# Patient Record
Sex: Female | Born: 1935 | Race: White | Hispanic: No | Marital: Married
Health system: Southern US, Community
[De-identification: ages and names within clinical notes are randomized; demographics above are authoritative.]

---

## 2003-12-26 ENCOUNTER — Ambulatory Visit: Payer: Self-pay | Admitting: Family Medicine

## 2004-11-18 ENCOUNTER — Emergency Department: Payer: Self-pay | Admitting: Unknown Physician Specialty

## 2005-01-13 ENCOUNTER — Ambulatory Visit: Payer: Self-pay

## 2005-09-06 ENCOUNTER — Ambulatory Visit: Payer: Self-pay | Admitting: Family Medicine

## 2006-12-06 ENCOUNTER — Ambulatory Visit: Payer: Self-pay | Admitting: Ophthalmology

## 2007-03-05 ENCOUNTER — Ambulatory Visit: Payer: Self-pay | Admitting: Family Medicine

## 2007-04-05 ENCOUNTER — Ambulatory Visit: Payer: Self-pay | Admitting: Family Medicine

## 2008-10-17 ENCOUNTER — Ambulatory Visit: Payer: Self-pay | Admitting: Family Medicine

## 2008-11-07 ENCOUNTER — Ambulatory Visit: Payer: Self-pay | Admitting: Internal Medicine

## 2008-11-12 ENCOUNTER — Ambulatory Visit: Payer: Self-pay | Admitting: Internal Medicine

## 2008-11-18 ENCOUNTER — Emergency Department: Payer: Self-pay | Admitting: Emergency Medicine

## 2009-05-01 ENCOUNTER — Ambulatory Visit: Payer: Self-pay | Admitting: Gastroenterology

## 2009-05-05 ENCOUNTER — Ambulatory Visit: Payer: Self-pay | Admitting: Gastroenterology

## 2010-08-25 ENCOUNTER — Ambulatory Visit: Payer: Self-pay | Admitting: Internal Medicine

## 2012-02-18 ENCOUNTER — Emergency Department: Payer: Self-pay | Admitting: Emergency Medicine

## 2012-02-28 ENCOUNTER — Emergency Department: Payer: Self-pay | Admitting: Emergency Medicine

## 2012-03-04 ENCOUNTER — Emergency Department: Payer: Self-pay | Admitting: Emergency Medicine

## 2012-03-04 LAB — URINALYSIS, COMPLETE
Bilirubin,UR: NEGATIVE
Blood: NEGATIVE
Glucose,UR: NEGATIVE mg/dL (ref 0–75)
Ketone: NEGATIVE
Leukocyte Esterase: NEGATIVE
Nitrite: NEGATIVE
Ph: 7 (ref 4.5–8.0)
Protein: NEGATIVE
RBC,UR: 1 /HPF (ref 0–5)
Specific Gravity: 1.015 (ref 1.003–1.030)
Squamous Epithelial: NONE SEEN
WBC UR: 2 /HPF (ref 0–5)

## 2012-03-04 LAB — COMPREHENSIVE METABOLIC PANEL
Albumin: 2.8 g/dL — ABNORMAL LOW (ref 3.4–5.0)
Alkaline Phosphatase: 107 U/L (ref 50–136)
Anion Gap: 6 — ABNORMAL LOW (ref 7–16)
Bilirubin,Total: 0.4 mg/dL (ref 0.2–1.0)
Co2: 29 mmol/L (ref 21–32)
Creatinine: 0.82 mg/dL (ref 0.60–1.30)
EGFR (Non-African Amer.): >60
Glucose: 86 mg/dL (ref 65–99)
Potassium: 4.1 mmol/L (ref 3.5–5.1)
SGOT(AST): 16 U/L (ref 15–37)
Total Protein: 6.2 g/dL — ABNORMAL LOW (ref 6.4–8.2)

## 2012-03-04 LAB — CBC
HCT: 44.8 % (ref 35.0–47.0)
HGB: 15.3 g/dL (ref 12.0–16.0)
MCH: 29.1 pg (ref 26.0–34.0)
MCV: 85 fL (ref 80–100)
Platelet: 313 10*3/uL (ref 150–440)

## 2012-03-04 LAB — PRO B NATRIURETIC PEPTIDE: B-Type Natriuretic Peptide: 678 pg/mL — ABNORMAL HIGH (ref 0–450)

## 2012-03-04 LAB — CK TOTAL AND CKMB (NOT AT ARMC): CK-MB: 0.8 ng/mL (ref 0.5–3.6)

## 2012-05-05 ENCOUNTER — Observation Stay: Payer: Self-pay | Admitting: Internal Medicine

## 2012-05-05 LAB — COMPREHENSIVE METABOLIC PANEL
Alkaline Phosphatase: 90 U/L (ref 50–136)
Anion Gap: 6 — ABNORMAL LOW (ref 7–16)
Calcium, Total: 8.3 mg/dL — ABNORMAL LOW (ref 8.5–10.1)
Chloride: 96 mmol/L — ABNORMAL LOW (ref 98–107)
Co2: 28 mmol/L (ref 21–32)
Creatinine: 0.74 mg/dL (ref 0.60–1.30)
EGFR (African American): >60
EGFR (Non-African Amer.): >60
Glucose: 87 mg/dL (ref 65–99)
Osmolality: 262 (ref 275–301)
Potassium: 4.2 mmol/L (ref 3.5–5.1)
SGOT(AST): 33 U/L (ref 15–37)
Total Protein: 6.5 g/dL (ref 6.4–8.2)

## 2012-05-05 LAB — LIPID PANEL
Cholesterol: 176 mg/dL (ref 0–200)
Ldl Cholesterol, Calc: 112 mg/dL — ABNORMAL HIGH (ref 0–100)
Triglycerides: 148 mg/dL (ref 0–200)

## 2012-05-05 LAB — URINALYSIS, COMPLETE
Blood: NEGATIVE
Glucose,UR: NEGATIVE mg/dL (ref 0–75)
Leukocyte Esterase: NEGATIVE
Ph: 7 (ref 4.5–8.0)
RBC,UR: <1 /HPF (ref 0–5)
Specific Gravity: 1.015 (ref 1.003–1.030)
WBC UR: <1 /HPF (ref 0–5)

## 2012-05-05 LAB — CK TOTAL AND CKMB (NOT AT ARMC)
CK, Total: 81 U/L (ref 21–215)
CK-MB: 0.7 ng/mL (ref 0.5–3.6)

## 2012-05-05 LAB — CBC
HCT: 46 % (ref 35.0–47.0)
HGB: 16.1 g/dL — ABNORMAL HIGH (ref 12.0–16.0)
MCH: 29.4 pg (ref 26.0–34.0)
MCHC: 34.9 g/dL (ref 32.0–36.0)
MCV: 84 fL (ref 80–100)
RBC: 5.46 10*6/uL — ABNORMAL HIGH (ref 3.80–5.20)
RDW: 14.6 % — ABNORMAL HIGH (ref 11.5–14.5)

## 2012-05-06 LAB — CBC WITH DIFFERENTIAL/PLATELET
Basophil %: 1.3 %
Eosinophil %: 0.7 %
HCT: 40.9 % (ref 35.0–47.0)
HGB: 14.3 g/dL (ref 12.0–16.0)
Lymphocyte %: 15.4 %
MCH: 29.5 pg (ref 26.0–34.0)
MCHC: 35.1 g/dL (ref 32.0–36.0)
Monocyte #: 0.9 x10 3/mm (ref 0.2–0.9)
Monocyte %: 9.8 %
Platelet: 195 10*3/uL (ref 150–440)
RDW: 14.2 % (ref 11.5–14.5)

## 2012-05-06 LAB — BASIC METABOLIC PANEL
Anion Gap: 9 (ref 7–16)
BUN: 15 mg/dL (ref 7–18)
Calcium, Total: 7.6 mg/dL — ABNORMAL LOW (ref 8.5–10.1)
Co2: 24 mmol/L (ref 21–32)
EGFR (African American): >60
EGFR (Non-African Amer.): >60
Glucose: 87 mg/dL (ref 65–99)
Sodium: 136 mmol/L (ref 136–145)

## 2012-05-06 LAB — URINE CULTURE

## 2012-05-06 LAB — CK TOTAL AND CKMB (NOT AT ARMC): CK-MB: <0.5 ng/mL — ABNORMAL LOW (ref 0.5–3.6)

## 2012-05-06 LAB — TROPONIN I: Troponin-I: <0.02 ng/mL

## 2012-05-08 LAB — CBC WITH DIFFERENTIAL/PLATELET
Basophil #: 0 10*3/uL (ref 0.0–0.1)
Basophil %: 0.3 %
Eosinophil #: 0.2 10*3/uL (ref 0.0–0.7)
Eosinophil %: 1.9 %
HCT: 42 % (ref 35.0–47.0)
Lymphocyte #: 1.5 10*3/uL (ref 1.0–3.6)
MCH: 29 pg (ref 26.0–34.0)
MCHC: 34.5 g/dL (ref 32.0–36.0)
MCV: 84 fL (ref 80–100)
Monocyte #: 0.8 x10 3/mm (ref 0.2–0.9)
Monocyte %: 7.2 %
Neutrophil #: 8.5 10*3/uL — ABNORMAL HIGH (ref 1.4–6.5)
Neutrophil %: 76.9 %
Platelet: 211 10*3/uL (ref 150–440)
RBC: 4.99 10*6/uL (ref 3.80–5.20)
WBC: 11 10*3/uL (ref 3.6–11.0)

## 2012-05-08 LAB — BASIC METABOLIC PANEL
Anion Gap: 5 — ABNORMAL LOW (ref 7–16)
BUN: 11 mg/dL (ref 7–18)
Chloride: 105 mmol/L (ref 98–107)
Creatinine: 0.65 mg/dL (ref 0.60–1.30)
EGFR (Non-African Amer.): >60
Potassium: 3.4 mmol/L — ABNORMAL LOW (ref 3.5–5.1)

## 2012-05-11 LAB — CULTURE, BLOOD (SINGLE)

## 2012-08-11 ENCOUNTER — Ambulatory Visit: Payer: Self-pay | Admitting: Internal Medicine

## 2012-11-21 ENCOUNTER — Inpatient Hospital Stay: Payer: Self-pay | Admitting: Internal Medicine

## 2012-11-21 LAB — COMPREHENSIVE METABOLIC PANEL
Albumin: 3 g/dL — ABNORMAL LOW (ref 3.4–5.0)
Anion Gap: 5 — ABNORMAL LOW (ref 7–16)
BUN: 19 mg/dL — ABNORMAL HIGH (ref 7–18)
Bilirubin,Total: 0.4 mg/dL (ref 0.2–1.0)
Calcium, Total: 8.7 mg/dL (ref 8.5–10.1)
Chloride: 102 mmol/L (ref 98–107)
Co2: 28 mmol/L (ref 21–32)
EGFR (African American): >60
EGFR (Non-African Amer.): >60
Glucose: 138 mg/dL — ABNORMAL HIGH (ref 65–99)
Osmolality: 275 (ref 275–301)
Potassium: 3.9 mmol/L (ref 3.5–5.1)
SGPT (ALT): 14 U/L (ref 12–78)
Total Protein: 6.6 g/dL (ref 6.4–8.2)

## 2012-11-21 LAB — URINALYSIS, COMPLETE
Blood: NEGATIVE
Glucose,UR: NEGATIVE mg/dL (ref 0–75)
Squamous Epithelial: 1
WBC UR: 10 /HPF (ref 0–5)

## 2012-11-21 LAB — CBC
HCT: 43.2 % (ref 35.0–47.0)
MCH: 29.3 pg (ref 26.0–34.0)
MCHC: 35.1 g/dL (ref 32.0–36.0)
MCV: 84 fL (ref 80–100)

## 2012-11-21 LAB — APTT: Activated PTT: 33.3 secs (ref 23.6–35.9)

## 2012-11-21 LAB — TROPONIN I: Troponin-I: <0.02 ng/mL

## 2012-11-22 LAB — CBC WITH DIFFERENTIAL/PLATELET
Basophil %: 0.1 %
Eosinophil %: 0 %
HGB: 11.4 g/dL — ABNORMAL LOW (ref 12.0–16.0)
Lymphocyte %: 6.4 %
MCHC: 35.9 g/dL (ref 32.0–36.0)
MCV: 82 fL (ref 80–100)
Neutrophil #: 12.2 10*3/uL — ABNORMAL HIGH (ref 1.4–6.5)
Neutrophil %: 85.6 %
RBC: 3.85 10*6/uL (ref 3.80–5.20)
WBC: 14.3 10*3/uL — ABNORMAL HIGH (ref 3.6–11.0)

## 2012-11-22 LAB — BASIC METABOLIC PANEL
Anion Gap: 4 — ABNORMAL LOW (ref 7–16)
Creatinine: 0.76 mg/dL (ref 0.60–1.30)
Osmolality: 273 (ref 275–301)
Potassium: 4.4 mmol/L (ref 3.5–5.1)
Sodium: 134 mmol/L — ABNORMAL LOW (ref 136–145)

## 2012-11-23 LAB — BASIC METABOLIC PANEL
BUN: 20 mg/dL — ABNORMAL HIGH (ref 7–18)
Calcium, Total: 6.9 mg/dL — CL (ref 8.5–10.1)
Creatinine: 0.7 mg/dL (ref 0.60–1.30)
EGFR (African American): >60
Potassium: 2.9 mmol/L — ABNORMAL LOW (ref 3.5–5.1)
Sodium: 138 mmol/L (ref 136–145)

## 2012-11-23 LAB — CBC WITH DIFFERENTIAL/PLATELET
Basophil #: 0 10*3/uL (ref 0.0–0.1)
Eosinophil #: 0 10*3/uL (ref 0.0–0.7)
HGB: 8.3 g/dL — ABNORMAL LOW (ref 12.0–16.0)
Lymphocyte %: 6.7 %
MCHC: 35.9 g/dL (ref 32.0–36.0)
MCV: 83 fL (ref 80–100)
Monocyte #: 1.1 x10 3/mm — ABNORMAL HIGH (ref 0.2–0.9)
Neutrophil %: 84 %
Platelet: 236 10*3/uL (ref 150–440)
RDW: 14 % (ref 11.5–14.5)
WBC: 12.1 10*3/uL — ABNORMAL HIGH (ref 3.6–11.0)

## 2012-11-23 LAB — MAGNESIUM: Magnesium: 1.2 mg/dL — ABNORMAL LOW

## 2012-11-24 LAB — POTASSIUM: Potassium: 3.4 mmol/L — ABNORMAL LOW (ref 3.5–5.1)

## 2012-11-24 LAB — URINE CULTURE

## 2012-11-24 LAB — MAGNESIUM: Magnesium: 2.4 mg/dL

## 2012-11-28 LAB — COMPREHENSIVE METABOLIC PANEL
Albumin: 1.8 g/dL — ABNORMAL LOW (ref 3.4–5.0)
Alkaline Phosphatase: 136 U/L (ref 50–136)
Anion Gap: 6 — ABNORMAL LOW (ref 7–16)
Bilirubin,Total: 0.7 mg/dL (ref 0.2–1.0)
Calcium, Total: 8.5 mg/dL (ref 8.5–10.1)
Co2: 28 mmol/L (ref 21–32)
EGFR (African American): >60
EGFR (Non-African Amer.): >60
Glucose: 115 mg/dL — ABNORMAL HIGH (ref 65–99)
Osmolality: 303 (ref 275–301)
SGOT(AST): 22 U/L (ref 15–37)
Sodium: 146 mmol/L — ABNORMAL HIGH (ref 136–145)
Total Protein: 5.4 g/dL — ABNORMAL LOW (ref 6.4–8.2)

## 2012-11-28 LAB — URINALYSIS, COMPLETE
Bacteria: NONE SEEN
Bilirubin,UR: NEGATIVE
Ketone: NEGATIVE
Leukocyte Esterase: NEGATIVE
Nitrite: NEGATIVE
Ph: 5 (ref 4.5–8.0)
Protein: NEGATIVE
RBC,UR: 1 /HPF (ref 0–5)
Specific Gravity: 1.021 (ref 1.003–1.030)

## 2012-11-28 LAB — CBC WITH DIFFERENTIAL/PLATELET
Basophil #: 0.2 10*3/uL — ABNORMAL HIGH (ref 0.0–0.1)
Eosinophil #: 0 10*3/uL (ref 0.0–0.7)
Eosinophil %: 0 %
HCT: 27.4 % — ABNORMAL LOW (ref 35.0–47.0)
MCHC: 34.1 g/dL (ref 32.0–36.0)
MCV: 86 fL (ref 80–100)
Monocyte #: 1.3 x10 3/mm — ABNORMAL HIGH (ref 0.2–0.9)
Monocyte %: 5 %
Platelet: 585 10*3/uL — ABNORMAL HIGH (ref 150–440)
RBC: 3.19 10*6/uL — ABNORMAL LOW (ref 3.80–5.20)
RDW: 14.4 % (ref 11.5–14.5)
WBC: 25.9 10*3/uL — ABNORMAL HIGH (ref 3.6–11.0)

## 2012-11-28 LAB — LIPASE, BLOOD: Lipase: 405 U/L — ABNORMAL HIGH (ref 73–393)

## 2012-11-29 ENCOUNTER — Inpatient Hospital Stay: Payer: Self-pay | Admitting: Family Medicine

## 2012-11-29 LAB — CREATININE, SERUM: Creatinine: 0.88 mg/dL (ref 0.60–1.30)

## 2012-11-29 LAB — MAGNESIUM: Magnesium: 1.9 mg/dL

## 2012-11-29 LAB — POTASSIUM: Potassium: 2.3 mmol/L — CL (ref 3.5–5.1)

## 2012-11-29 LAB — SODIUM: Sodium: 149 mmol/L — ABNORMAL HIGH (ref 136–145)

## 2012-11-30 ENCOUNTER — Ambulatory Visit: Payer: Self-pay | Admitting: Hospice and Palliative Medicine

## 2012-11-30 LAB — CBC WITH DIFFERENTIAL/PLATELET
Bands: 2 %
Basophil %: 0.1 %
Eosinophil %: 0 %
HCT: 30.5 % — ABNORMAL LOW (ref 35.0–47.0)
HGB: 10.1 g/dL — ABNORMAL LOW (ref 12.0–16.0)
Lymphocyte #: 0.8 10*3/uL — ABNORMAL LOW (ref 1.0–3.6)
Lymphocyte %: 2 %
Lymphocytes: 2 %
MCHC: 33 g/dL (ref 32.0–36.0)
MCV: 86 fL (ref 80–100)
Neutrophil %: 93.5 %
Platelet: 713 10*3/uL — ABNORMAL HIGH (ref 150–440)
RBC: 3.54 10*6/uL — ABNORMAL LOW (ref 3.80–5.20)
RDW: 14.8 % — ABNORMAL HIGH (ref 11.5–14.5)
Variant Lymphocyte - H1-Rlymph: 6 %
WBC: 42.7 10*3/uL — ABNORMAL HIGH (ref 3.6–11.0)

## 2012-11-30 LAB — HEPATIC FUNCTION PANEL A (ARMC)
Albumin: 1.8 g/dL — ABNORMAL LOW (ref 3.4–5.0)
Bilirubin,Total: 0.9 mg/dL (ref 0.2–1.0)
SGPT (ALT): 16 U/L (ref 12–78)
Total Protein: 5.6 g/dL — ABNORMAL LOW (ref 6.4–8.2)

## 2012-11-30 LAB — BASIC METABOLIC PANEL
Anion Gap: 9 (ref 7–16)
Chloride: 116 mmol/L — ABNORMAL HIGH (ref 98–107)
Co2: 22 mmol/L (ref 21–32)
Creatinine: 1.03 mg/dL (ref 0.60–1.30)
EGFR (African American): 60 — ABNORMAL LOW
EGFR (Non-African Amer.): 52 — ABNORMAL LOW
Glucose: 147 mg/dL — ABNORMAL HIGH (ref 65–99)
Potassium: 3.1 mmol/L — ABNORMAL LOW (ref 3.5–5.1)
Sodium: 147 mmol/L — ABNORMAL HIGH (ref 136–145)

## 2012-11-30 LAB — LIPASE, BLOOD: Lipase: 220 U/L (ref 73–393)

## 2012-11-30 LAB — LACTATE DEHYDROGENASE: LDH: 238 U/L (ref 81–246)

## 2012-12-03 LAB — CULTURE, BLOOD (SINGLE)

## 2012-12-18 ENCOUNTER — Ambulatory Visit: Payer: Self-pay | Admitting: Hospice and Palliative Medicine

## 2012-12-18 DEATH — deceased

## 2014-05-10 NOTE — Op Note (Signed)
PATIENT NAME:  Jennifer RedoMOORE, Olayinka D MR#:  [id number] DATE OF BIRTH:  February 26, 1931  DATE OF PROCEDURE:  11/22/2012  PREOPERATIVE DIAGNOSIS: Comminuted left supracondylar femur fracture with intracondylar extension.   POSTOPERATIVE DIAGNOSIS: Comminuted left supracondylar femur fracture with intracondylar extension.   PROCEDURE: Open reduction and internal fixation, left distal femur.   ANESTHESIA: Spinal.   SURGEON: Kennedy BuckerMichael Everardo Voris, M.D.   DESCRIPTION OF PROCEDURE: The patient was brought to the operating room, and after adequate spinal anesthesia was obtained, she was transferred to the fracture table; the right leg in a well legholder, left leg in the traction boot. Longitudinal traction was applied, and there was acceptable alignment in both AP and lateral projections with some slight posterior sag of the distal fragment. After prepped and draped in the usual sterile method, timeout procedure was completed. A distal incision was made over the lateral femoral condyle, and the IT band split and the distal femur exposed. A Biomet POLYAX 9-hole left femoral plate was applied. This had been measured preoperatively, slid up in a submuscular fashion and placed at the appropriate level distally. A center guide pin was placed, and a lag screw was inserted, although it really did not give compression because of severe osteopenia. There was severe osteopenia of the distal fragment. With AP and lateral imaging of the distal femur and appropriate position of the plate, the distal screw holes were filled using multiple locking screws and a central large locking screw. This gave a stable construct distally. Going proximally, at the proximal end of the plate, a  3 inch incision was made, and the subcutaneous tissue spread, the IT band split and the vastus lateralis elevated. The plate was slightly posterior. It was brought to the midportion of the plate and 3 proximal cortical screws were placed. Using the percutaneous  technique and using the drill sleeves, 3 additional screws were placed adjacent to these most proximal screws. AP and lateral imaging showed acceptable position with axial and lateral alignment with just slight displacement. At this point, the wounds were thoroughly irrigated. The IT band was repaired proximally with a running 0 Vicryl. Distally the IT band likewise closed, 2-0 Vicryl subcutaneously and skin staples. Xeroform, 4 x 4's, ABDs and tape applied.   Total EBL was approximately 500 mL with some fracture hematoma present.   IMPLANT: The left 9-hole, POLYAX femoral plate with multiple screws, proximal and distal. For the locking screws, a torque limiter was utilized.   There were no complications. No specimen. EBL was 500.   CONDITION: To recovery room, stable.     ____________________________ Leitha SchullerMichael J. Hence Derrick, MD mjm:dmm D: 11/22/2012 22:50:37 ET T: 11/22/2012 23:00:02 ET JOB#: [id number]  cc: Leitha SchullerMichael J. Brynda Heick, MD, <Dictator> Leitha SchullerMICHAEL J Lajuanda Penick MD ELECTRONICALLY SIGNED 11/23/2012 7:25

## 2014-05-10 NOTE — Discharge Summary (Signed)
PATIENT NAME:  Jennifer Ibarra, Jennifer Ibarra MR#:  [id number] DATE OF BIRTH:  [birth year]  DATE OF ADMISSION:  11/29/2012 DATE OF DISCHARGE:  11/30/2012  DISPOSITION: Hospice Home.   REASON FOR ADMISSION: Weakness, fever and poor appetite.   HISTORY OF PRESENT ILLNESS: This is a very nice 81 year old female who had been discharged on November 8th, before 2 days. The patient was hospitalized due to a left distal femur fracture. She underwent an open reduction and internal fixation, and her hospitalization was also complicated with E. coli urinary tract infection. The patient had also chronic respiratory failure due to COPD and had problems with her potassium and magnesium electrolytes. The patient was discharged to a skilled nursing facility/rehab due to her advanced dementia and not being able to go home as she needed continuous supervision. The patient was admitted after 2 days of not doing well at the skilled nursing facility. No participating in her therapy, very poor oral intake and starting to become lethargic. She had a temperature of 100.5, she was tachycardic in the 130s, her blood pressure was in the normal-low parameters and she looked critically ill. Her electrolytes were also imbalanced with hypernatremia, hypokalemia, looking severely dehydrated. Her belly was distended and tender. A CT scan of the abdomen was done showing some ileus. The patient was admitted for possible infection of unclear source. She was showing some symptoms of sepsis, including tachycardia, leukocytosis of 26,000 and a fever of 100.5 with tachypnea. The patient also had altered mental status and looked very sick.   HOSPITAL COURSE: The patient was put on the floor, and she was treated with broad-spectrum antibiotics, including Zosyn and vancomycin. When I evaluated the patient the following day, it looked her pathology could be respiratory as the patient was starting to have significant labored breathing, for which we added on  azithromycin to cover for atypical organisms for pneumonia. A chest x-ray was done at the moment, and it showed some chronic things, maybe atelectasis, maybe some fullness at the level of the right middle lobe and perihilar area, but this was not commented on in the x-rays by the radiologist. The patient started to decline significantly, and she communicated to her family her desire to just be left alone and that she was ready to die if that was her time. We had a long discussion with the family the next day because she was starting to get more labored breathing and having significant difficulties. The patient was not eating. She had a very distended belly. We offered to treat her with an NG tube to decompress her abdomen and maybe do a little bit more of catharsis as we started the previous day with suppositories, but the patient and the family did not really want to pursue anything for this, for which [facility].   DISCHARGE CONDITION: The patient is discharged in critical condition, looking septic, toxic, with decreased mental status, to the Hospice Home, and the family is agreeable to this.   TIME SPENT: I spent about 45 minutes with this patient at discharge.   ____________________________ Felipa Furnaceoberto Sanchez Gutierrez, MD rsg:lb Ibarra: 12/01/2012 06:59:08 ET T: 12/01/2012 09:13:54 ET JOB#: [id number]  cc: Felipa Furnaceoberto Sanchez Gutierrez, MD, <Dictator> Laysa Kimmey Juanda ChanceSANCHEZ GUTIERRE MD ELECTRONICALLY SIGNED 12/06/2012 23:10

## 2014-05-10 NOTE — Discharge Summary (Signed)
PATIENT NAME:  Jennifer Ibarra, Jennifer Ibarra MR#:  [id number] DATE OF BIRTH:  07/10/1931  DATE OF ADMISSION:  05/05/2012 DATE OF DISCHARGE:  05/11/2012  FINAL DIAGNOSES: 1.  Encephalopathy.  2.  Weakness.  3.  Pneumonia, possible aspiration.  4.  Hypertension.  5.  Dementia.  6.  Scleroderma.  7.  Chronic obstructive pulmonary disease, and chronic respiratory failure.   MEDICATIONS ON DISCHARGE: Included Aricept 10 mg at bedtime, amlodipine 5 mg daily, omeprazole 20 mg daily, potassium chloride 10 mEq twice a day, aspirin 81 mg daily, Iron Chews 1 tablet daily, vitamin D3 5000 international units daily, Spiriva 18 mcg daily, Perforomist 20 mcg/2 mL, 2 mL twice a day, ProAir HFA 2 puffs every 6 hours as needed for shortness of breath or wheezing, calcium citrate 1 tablet of 500 mg twice a day, levofloxacin  250 mg, 1 tablet daily for 6 days, then stop.  Dry dressing to right lower extremity.   HOME OXYGEN: Yes, nasal cannula at 1.5 liters.   DIET: Low-sodium diet, regular consistency. I advised to take a drink every few bites of food. Chew food completely before swallowing.   ACTIVITY: As tolerated with physical therapy.   FOLLOWUP: In 1 to 2 days with doctor at rehab.   The patient was admitted 05/05/2012, discharged 05/11/2012. Came in with altered mental status, generalized weakness. The patient was given IV fluids. MRI of the brain was done to rule out stroke. Physical therapy evaluation was done.   LABORATORY AND RADIOLOGICAL DATA DURING THE HOSPITAL COURSE: Included LDL of 112, HDL of 34, triglycerides 148.   Blood cultures were negative.   Troponin negative. Glucose 87, BUN 19, creatinine 0.74, sodium 130, potassium 4.2, chloride 96, CO2 28, calcium 8.3.   Liver function tests normal. Albumin low at 2.9.   White blood cell count 11.1, H and H 16.1 and 46.6, platelet count of 237.   ABG shows a pH of 7.49, pCO2 of 35, pO2 of 61, bicarb at 26.7, oxygen saturation 94.4.   Urinalysis  negative. Urine culture negative.   Chest x-ray: Pneumonitis cannot be excluded.   CT scan of the head: No acute intracranial process. Chronic small-vessel ischemic disease.   Ultrasound of the carotids: No hemodynamically significant stenosis.   MRI of the brain showed findings consistent with small-vessel white matter ischemic changes. No acute infarct. CRP 7.65.   Next two troponins were negative.   CT scan of the chest for pulmonary embolism showed no pulmonary embolism. Patchy lower lobe infiltrate versus atelectasis. Abnormal appearance of the posterior mediastinum could  reflect a dilated esophagus with some retained ingested material, underlying reflux, and achalasia.   HOSPITAL COURSE:   Per problem list:   1.  For the patient's encephalopathy and weakness the patient was given IV fluids and antibiotic for pneumonia as per caregiver. Mental status is back to her baseline. Still waiting to see how she does with regards to her strength since her mental status is back to baseline. The patient will be discharged to rehab.  2.  Pneumonia: Possible aspiration, with abnormal CT scan of the distal esophagus likely related to the scleroderma. I advised small bites, chewing food completely, and taking a drink in between, making sure things go down completely before taking the next bite. The patient will be on Levaquin for another 6 days.  3.  Hypertension: She is on amlodipine.  4.  Dementia: On Aricept.  5.  Scleroderma: Not on any medication for this.  6.  Chronic obstructive pulmonary disease, chronic respiratory failure, on oxygen: Perforomist, Spiriva, and p.r.n. ProAir.  Time spent on discharge: Thirty-five minutes.    ____________________________ Herschell Dimesichard J. Renae GlossWieting, MD rjw:dm D: 05/08/2012 10:19:00 ET T: 05/08/2012 11:06:57 ET JOB#: [id number]  cc: Herschell Dimesichard J. Renae GlossWieting, MD, <Dictator> Salley ScarletICHARD J Torsha Lemus MD ELECTRONICALLY SIGNED 05/10/2012 13:30

## 2014-05-10 NOTE — H&P (Signed)
PATIENT NAME:  Jennifer Ibarra, Jennifer Ibarra MR#:  [id number] DATE OF BIRTH:  01/31/1931  DATE OF ADMISSION:  11/29/2012  REFERRING PHYSICIAN: Dr. York Cerise  PRIMARY CARE PHYSICIAN: Dr. Harrington Challenger  CHIEF COMPLAINT: Weakness, fever, poor appetite.   HISTORY OF PRESENT ILLNESS: This is a 81 year old female who was recently discharged on November 8, before two days, from [facility] status post hospitalization due to left distal femur fracture, status post open reduction and internal fixation surgery. As well, patient's hospital stay was complicated by Escherichia coli urinary tract infection and acute on chronic respiratory failure, secondary to her chronic obstructive pulmonary disease. As well, she had multiple electrolyte abnormalities, more significant for hypokalemia and hypomagnesemia, which were corrected prior to discharge. The patient was discharged to subacute rehab. The patient has a baseline dementia, has 24/7 hour care at home at baseline. The patient is unable to provide any reliable history or review of systems. Son at bedside provides most of the history. As well, history was obtained from ED staff. Son reports that for the last two days, the patient has not been doing well, could not participate very well with physical therapy, and she does not have good p.o. intake, appears to be more lethargic, more somnolent than her baseline, which prompted him to bring her to the ED. Her temperature was 100.5. She was, as well, tachycardic, 130 initially upon presentation. Blood pressure was on the softer side, 100/72. The patient was saturating 99% on oxygen. She is known to be on 2 liters nasal cannula at baseline. The patient had basic work-up done, which did show multiple electrolyte abnormalities, mainly hypokalemia, potassium 2.7, hyponatremia, sodium of 146, hypoalbuminemia at 1.8, and white blood cells of 25.9. The patient initially had mild abdominal tenderness on the physical exam, so she had CT abdomen and  pelvis with contrast done, which was significant only for ileus. At the surgical site, the patient does not have any significant oozing or drainage from site. The patient's urinalysis was negative. Chest x-ray did not show any opacity or infiltrate. She was started empirically on IV antibiotics for SIRS. Hospitalist service was requested to admit the patient for further management and work-up of her SIRS.   PAST MEDICAL HISTORY: 1.  Chronic obstructive pulmonary disease, on 2 liters nasal cannula at baseline.  2.  Scleroderma.  3. Osteoporosis.  4.  Hypertension.  5.  Dementia.  6.  History of diverticulitis.   PAST SURGICAL HISTORY: Recent left distal femoral fracture, status post open reduction and internal fixation surgery by Dr. Rosita Kea.   ALLERGIES: NO KNOWN DRUG ALLERGIES.   SOCIAL HISTORY: She was a previous smoker. No alcohol. No drug use. Usually lives at home with 24/7 assistance. Currently she is in subacute rehab.   FAMILY HISTORY: Significant for CVA and hypertension in her father.   REVIEW OF SYSTEMS: Tried to obtain, but was unable secondary to the patient's dementia. She cannot provide any reliable review of systems.   HOME MEDICATIONS: 1.  Acetaminophen/hydrocodone 325/5 mg oral every 4 to 6 hours as needed.  2.  Bayer aspirin 81 mg oral daily.  3.  Lovenox 40 mg subcutaneous daily.  4.  Perforomist 2 puffs b.i.d.  5.  Albuterol as needed.  6.  Spiriva 18 mcg inhalation daily.  7.  Norvasc 5 mg oral daily.  8.  Donepezil 10 mg oral at bedtime.  9.  Hydrochlorothiazide 12.5 oral daily.  10.  Ferrous sulfate 27 mg oral daily.  11.  Potassium chloride 10  mEq p.o. b.i.d.  12.  Omeprazole 20 mg oral daily.  13.  Levaquin 500 mg oral every 24 hours x3 days.  14.  Citracal plus vitamin D 1 tablet 2 times a day.  15.  Vitamin D3, 5000 international units oral daily.   PHYSICAL EXAMINATION: VITAL SIGNS: Maximum temperature 100.5, pulse 130, respirations 18, blood pressure  100/72. GENERAL:  A frail, ill-appearing female, lies comfortably in bed, in no apparent distress.  HEENT: Head: Left temporal occipital orbital old bruises from her previous fall on November 4. No current bleed or ooze or dried blood on the site. Pupils are equal and reactive to light. Pink conjunctivae. Dry oral mucosa.  NECK: Supple. No thyromegaly. No JVD.  CHEST: Good air entry bilaterally. No wheezing, rales, rhonchi.  CARDIOVASCULAR: S1, S2 heard. No rubs, murmur or gallops.  ABDOMEN: Soft, nontender, nondistended. Had diminished bowel sounds on four quadrants.  EXTREMITIES: Has bilateral extremity edema, +2 bilaterally, had diminished pedal pulses, but they were felt and no evidence of cyanosis or ischemia.  MUSCULOSKELETAL: Bilateral joints with ecchymosis and swelling, but no warmth or pain to palpation.  SKIN: Has left lateral surgical scars with staples. No oozing, no discharge of the site throughout the entire length of the left femur, x3 surgical scars.  NEUROLOGIC: Unable to evaluate secondary to the patient altered mental status and dementia, but cranial nerves appear to be grossly intact. Appears to be moving all extremities without significant deficits appreciated.  PSYCHIATRIC: The patient is awake, alert x1 to 2, confused.  LYMPHATIC: No cervical lymphadenopathy could be appreciated.   PERTINENT LABS: Glucose 115, BUN 46, creatinine 0.83, sodium 146, potassium 2.62, chloride 112, CO2 28, lipase 405, total protein 5.4, albumin 1.8, total bilirubin 0.7, alkaline phosphatase 136, AST 22, ALT 13, troponin 0.04. White blood cells 25.9, hemoglobin 9.3, hematocrit 27.4, platelets 585. Urinalysis negative for leukocyte esterase and nitrite. Lactic acid 1.1.   IMAGING STUDIES: CT abdomen and pelvis with contrast showing multiple air stools and fluid-filled, mildly prominent, but nondilated colonic loops, which may be due to colonic ileus. Minimal perihepatic fluid and mild bibasilar linear  density, likely atelectasis, scarring, small hiatal hernia.  Chest, portable, single view x-ray showing mild bibasilar atelectasis.  ASSESSMENT AND PLAN: 1.  Systemic inflammatory response syndrome. The patient presents with fever of 100.5, tachycardia of 130, leukocytosis of 26,000. At this point, source of infection is unclear, as her urinalysis is negative, chest x-ray does not show any opacity or infiltrate. On physical exam, her surgical incisions look fine, no drainage or oozing, but given her recent orthopedic surgery, we will proceed with CT femur/thigh of the left side, with left knee as well to evaluate for any source of infection. Blood cultures were already sent. We will follow on the results. Will continue to cover patient with broad-spectrum antibiotics, IV vancomycin and Zosyn, pending and her septic work-up.  2.  Hypernatremia. This is most likely due to patient's dehydration, and we will hold her hydrochlorothiazide. We will start on IV fluids and will monitor closely.  3.  Hypokalemia. Will replace. Will add magnesium level. Will recheck level in a.m.  4.  Ileus.  This is a finding on CT abdomen; as well, the patient has sluggish bowel sounds. This is most likely related to her electrolyte imbalance. As well, may be pain medication is contributing to it. She will be kept n.p.o. except for meds, will be hydrated, will correct her electrolytes, and we will give her p.o. in a.m.  She has no nausea, no vomiting.  5.  Lower extremity edema. We will check but bilateral Doppler to rule out deep vein thrombosis.  6.  Protein calorie malnutrition, severe. The patient appears to be malnourished. As well, her albumin is 1.8. Once she is tolerating p.o., we will add Ensure to her diet.  7.  Chronic obstructive pulmonary disease. No wheezing, appears to be stable. Will continue on p.r.n. albuterol, Spiriva and Perforomist.  8.  Hypertension: Blood pressure is soft, on the lower side. Will hold off  home medications until she is more stable.  9.  Dementia. Continue with donepezil.  10.  Deep vein thrombosis prophylaxis. We will continue with subcutaneous Lovenox.  11.  Gastrointestinal prophylaxis. The patient is on proton pump inhibitor.   CODE STATUS: Discussed with the son, who is patient's health care power of attorney, Mr. Almetta LovelyRick Huar[place], phone number [phone]. The son was at bedside, reports she has a LIVING WILL. He will bring us a copy in a.m. as well, and she is a full code.   TOTAL TIME SPENT ON ADMISSION AND PATIENT CARE: 65 minutes.    ____________________________ Starleen Armsawood S. Anahid Eskelson, MD dse:cg D: 11/29/2012 01:05:56 ET T: 11/29/2012 01:33:34 ET JOB#: [id number]  cc: Starleen Armsawood S. Ravon Mcilhenny, MD, <Dictator> Priscilla Finklea Teena IraniS Kyndal Gloster MD ELECTRONICALLY SIGNED 12/24/2012 2:28

## 2014-05-10 NOTE — H&P (Signed)
PATIENT NAME:  Jennifer Ibarra, Jennifer Ibarra MR#:  [id number] DATE OF BIRTH:  1931-05-27  DATE OF ADMISSION:  05/05/2012  ADMITTING PHYSICIAN: Gladstone Lighter, MD  PRIMARY CARE PHYSICIAN: Christena Flake. Raechel Ache, MD  PRIMARY PULMONOLOGIST: [place] Raul Del, MD  CHIEF COMPLAINT: Altered mental status and generalized weakness.   HISTORY OF PRESENT ILLNESS: The patient is a 81 year old Caucasian female with past medical history significant for COPD, dementia, hypertension and scleroderma, brought into the hospital secondary to generalized weakness and more confusion. Due to dementia, the patient is not able to provide any history and most of the history is obtained from son at bedside. According to son, the patient lives with him, but he is at work all day so she has a CNA who dates with her from 19 to 4:30 every day and on Tuesdays and Thursdays, they have another aide staying between 5 to 9:00 p.m. The patient at baseline is alert, oriented to self, able to maintain simple conversation and answer simple questions. She has been having difficulty walking lately, requiring one-person assist even to go to the bathroom, but over the last couple days they found that the patient has been extremely weak and this morning could not even get out of bed and appears more confused than normal. She was here 3 months ago in the Emergency Room for similar complaints, was given IV fluids, a dose of antibiotic and was sent home. Today her labs do not show any evidence of urinary tract infection. She is slightly dehydrated based on the labs, so she is being admitted for altered mental status and generalized weakness.   PAST MEDICAL HISTORY:  1.  Scleroderma.  2.  Dementia.  3.  Hypertension.  4.  COPD.  5.  History of diverticulosis and diverticulitis.   PAST SURGICAL HISTORY:  1.  Benign breast lumpectomy.  2.  Hysterectomy.   ALLERGIES TO MEDICATIONS: No known drug allergies.   CURRENT HOME MEDICATIONS:  1.  Amlodipine 5 mg  p.o. daily.  2.  Aspirin 81 mg p.o. daily.  3.  Calcium citrate 1 tablet p.o. b.i.d.  4.  Donepezil 10 mg p.o. at bedtime.  5.  HCTZ 12.5 mg p.o. daily.  6.  Iron chews 1 tablet p.o. daily.  7.  Omeprazole 20 mg p.o. daily.  8.  Perforomist 20 mcg/2 mL, 2 mL inhaled twice a day.  9.  Potassium chloride 10 mEq p.o. b.i.d.  10.  ProAir inhaler 2 puffs q.6 hours p.r.n. for wheezing.  11.  Spiriva inhalation daily.  12.  Vitamin D3, 5000 international units p.o. daily.   SOCIAL HISTORY: Lives at home by herself, and son stays with her every evening and there is a CNA who takes care of her during the daytime. She stopped smoking more than 25 years ago. No alcohol use.   FAMILY HISTORY: Mom died in her late 57s due to congestive heart failure and dad in late 93s due to stroke. Recently, brother died and also had dementia like her.   REVIEW OF SYSTEMS: Difficult to be obtained secondary to the patient's dementia.   PHYSICAL EXAMINATION:  VITAL SIGNS: Temperature 99.1 degrees Fahrenheit, pulse 91, respirations 20, blood pressure 137/74, pulse oximetry 94% on room air.  GENERAL: A thin built, well-nourished female lying in bed, not in any acute distress.  HEENT: Normocephalic, atraumatic. Pupils equal, round, reacting to light. Anicteric sclerae. Extraocular movements intact. Oropharynx clear without erythema, mass or exudates.  NECK: Supple. No thyromegaly, JVD or carotid bruits. No  lymphadenopathy.  LUNGS: Moving air bilaterally. No wheeze or crackles. No use of accessory muscles for breathing.  CARDIOVASCULAR: S1, S2, regular rate and rhythm, 3/6 systolic murmur heard. No rubs or gallops.  ABDOMEN: Soft, nontender, nondistended. No hepatosplenomegaly. Normal bowel sounds.  EXTREMITIES: No pedal edema, no clubbing or cyanosis, 2+ dorsalis pedis pulses palpable bilaterally.  SKIN: No acne, rash or lesions.  LYMPHATIC: No cervical lymphadenopathy.  NEUROLOGIC: Cranial nerves intact. Upper  extremity strength is limited in the right upper extremity secondary to prior shoulder injury and chronic right shoulder pain, but distal muscle strength is 5/5 and left upper extremity strength is 5/5. Bilateral lower extremity strength is 4/5. No sensory changes. Sensation is intact.  PSYCHIATRIC: The patient is awake, alert, oriented to place and person   LABORATORY, DIAGNOSTIC AND RADIOLOGICAL DATA:  1.  WBC 11.1, hemoglobin 16.2, hematocrit 46, platelet count 237.  2.  Sodium 130, potassium 4.2, chloride 96, bicarbonate 28, BUN 19, creatinine 0.74, glucose 87 and calcium of 8.3.  3.  ALT 19, AST 33, alkaline phosphatase 90, total bilirubin 0.9 and albumin of 3.9. 4.  Urinalysis negative for any infection. 5.  Ultrasound Doppler of carotids bilaterally showing no hemodynamically significant stenosis.  6.  CT of the head showing no acute intracranial process and chronic small vessel ischemic disease.  7.  Chest x-ray showing mild pulmonary interstitial prominence, pneumonitis cannot be excluded, and these changes could be related to interstitial fibrosis. As compared to 03/04/2012 x-ray, they are still persistent. Degenerative changes with osteopenia. Deformity of right shoulder. Old right-sided rib fractures are also noted.  8.  Cardiac enzymes first set is negative.  9.  ESR is less than 2.  10.  LDL cholesterol elevated at 112, triglycerides 148, total cholesterol 176 and HDL of 34.  11.  EKG showing sinus arrhythmia with some PVCs, but otherwise heart rate is in the 80s.   ASSESSMENT AND PLAN: A 81 year old female with hypertension, dementia, scleroderma and chronic obstructive pulmonary disease, brought in for generalized weakness, more prominent in the lower extremities, unable to walk and more confused.  1.  Altered mental status and generalized weakness: Could be transient ischemic attack but more likely secondary to dehydration and also fluctuating dementia symptoms. We will admit under  observation. Get neuro checks, MRI of the brain and carotid Dopplers therapy. Physical therapy  consult to see if patient needs any rehab. Continue aspirin. Will add a statin. Continue intravenous fluids.  2.  Hypertension: Continue Norvasc. Due to dehydration status, will hold off on HCTZ.  3.  Dementia: Continue Aricept. Seems at baseline now, as she improved since she came in.  4.  Scleroderma: She had it for several years. Not on any steroids. She has chronic obstructive pulmonary disease secondary to scleroderma. Her chronic obstructive pulmonary disease is stable. Continue her home inhalers. ESR seems to be within normal limits.  5.  Gastrointestinal and deep vein thrombosis prophylaxis: On Protonix and Lovenox.   CODE STATUS: Full code.   TIME SPENT ON ADMISSION: 50 minutes.   ____________________________ Gladstone Lighter, MD rk:jm D: 05/05/2012 14:43:26 ET T: 05/05/2012 17:27:12 ET JOB#: [id number]  cc: Gladstone Lighter, MD, <Dictator> Christena Flake. Raechel Ache, MD Gladstone Lighter MD ELECTRONICALLY SIGNED 05/18/2012 15:34

## 2014-05-10 NOTE — Consult Note (Signed)
Brief Consult Note: Diagnosis: left distal femur fracture, severe osteopenia.   Patient was seen by consultant.   Recommend to proceed with surgery or procedure.   Comments: Recommend ORIF, discussed with family.  Risks, benefits, possible complications discussed.  Electronic Signatures: Leitha SchullerMenz, Yarisa Lynam J (MD)  (Signed [phone]-Nov-14 21:12)  Authored: Brief Consult Note   Last Updated: 04-Nov-14 21:12 by Leitha SchullerMenz, Toris Laverdiere J (MD)

## 2014-05-10 NOTE — Discharge Summary (Signed)
PATIENT NAME:  Jennifer Ibarra, Jennifer Ibarra MR#:  [id number] DATE OF BIRTH:  Mar 31, 1931  DATE OF ADMISSION:  11/21/2012 DATE OF DISCHARGE:  11/25/2012   PRIMARY CARE PHYSICIAN: Neomia Dearavid N. Harrington Challengerhies, MD  CONSULTATION: Orthopedic surgeon, Leitha SchullerMichael J. Menz, MD  DISCHARGE DIAGNOSES:  1. Escherichia coli urinary tract infection. 2. Acute on chronic respiratory failure, likely from underlying chronic obstructive pulmonary disease with postoperative changes.  3. Hypokalemia, hypomagnesemia.  4. Left distal femoral fracture, status post open reduction and internal fixation surgery.   CONDITION: Stable.   CODE STATUS: Full code.   HOME MEDICATIONS: [facility] reconciliation [facility].   DIET: Low sodium diet.   ACTIVITY: As tolerated.   FOLLOWUP CARE: Follow up with PCP within 1 to 2 weeks. Follow up with Dr. Rosita KeaMenz within 2 to 4 weeks. The patient needs to continue physical therapy in subacute rehab.   REASON FOR ADMISSION: Fall.   HOSPITAL COURSE: The patient is a 81 year old Caucasian female with a history of dementia, chronic obstructive pulmonary disease and osteoporosis, who was admitted for fall and left hip fracture. For detailed history and physical examination, please refer to the admission note dictated by Dr. Luberta MutterKonidena.   1. Left hip fracture. After admission, Dr. Rosita KeaMenz did ORIF. Today is third day after operation. The patient has no complaints. Had a bowel movement just now. The patient underwent physical therapy. Plan is to discharge to rehab today.  2. For chronic obstructive pulmonary disease and acute on chronic respiratory failure, the patient has been treated with oxygen by nasal cannula with nebulizer treatment. The patient still needs 3 liters oxygen by nasal cannula.  3. Hypokalemia, hypomagnesemia, was treated with supplement. Today's potassium is 3.4. We will continue potassium supplement. Magnesium increased to 2.4.  4. For dementia,  continue Aricept.  5. For hypertension, continue Norvasc.   The patient is clinically stable and will be discharged to subacute rehab today. I discussed the patient's discharge plan with the patient's son and the nurse.   TIME SPENT: About 36 minutes.   ____________________________ Shaune PollackQing Iridiana Fonner, MD qc:lb Ibarra: 11/25/2012 09:05:20 ET T: 11/25/2012 09:27:03 ET JOB#: [id number]  cc: Shaune PollackQing Edwards Mckelvie, MD, <Dictator> Shaune PollackQING Joseeduardo Brix MD ELECTRONICALLY SIGNED 11/25/2012 13:53

## 2014-05-10 NOTE — H&P (Signed)
PATIENT NAME:  Jennifer Ibarra, Jennifer Ibarra MR#:  [id number] DATE OF BIRTH:  12-19-1931  DATE OF ADMISSION:  11/21/2012  PRIMARY CARE PHYSICIAN:  Dr. Harrington Challenger.   CHIEF COMPLAINT: Fall.   HISTORY OF PRESENT ILLNESS: A 81 year old female with history of dementia, brought in because of a fall. The patient was trying to come out of the bathroom, where shelost balance  and fell on the floor. The patient is living at home, and she has a home health aide who takes care of her, and she was escorted to the bathroom. When coming out of her bathroom, she just lost balance and fell, suffered left hip fracture. The patient is going to be seen by Dr. Rosita Kea tomorrow. I was asked to admit for left hip fracture. The patient denies any complaints. Does have dementia, so unable to give much history.   PAST MEDICAL HISTORY:  Significant for history of COPD, scleroderma, osteoporosis, hypertension, dementia, diverticulitis.   ALLERGIES: No known allergies.   SOCIAL HISTORY: Previous smoker. No alcohol. No drugs.   MEDICATIONS: Amlodipine 5 mg p.o. daily, aspirin 81 mg p.o. daily, calcium with vitamin D 1 tablet p.o. b.i.d., Aricept 10 mg p.o. daily, ferrous sulfate 27 mg once a day, HCTZ 12.5 mg p.o. daily, omeprazole 20 mg daily, Perforomist 20 mcg so that is 2 mL inhalation b.i.d., KCL 10 mEq p.o. b.i.d., ProAir 2 puffs every 6 hours as needed for shortness of breath, Spiriva 18 mcg inhalation daily, vitamin D 5,000 units daily.   PAST SURGICAL HISTORY: None.   FAMILY HISTORY: The patient's dad had stroke and also hypertension.   REVIEW OF SYSTEMS: Not obtainable secondary to dementia.   PHYSICAL EXAMINATION: VITAL SIGNS: Temperature 97.6, heart rate 60, blood pressure 164/66, sats 89% on room air as documented.  GENERAL: The patient is awake and alert, oriented, and denies any complaints, not appearing to be in distress.  HEAD:  Normocephalic, atraumatic.  EYES: Pupils equally reacting to light. Extraocular movements are  intact.  NOSE: No nasal lesions. No drainage.  MOUTH: No lesions.  NECK: Supple. No JVD. No carotid bruit. Thyroid is in the midline, not enlarged.  RESPIRATORY:  Good respiratory effort. No wheezing. Clear to auscultation.  GASTROINTESTINAL: Abdomen is soft, nontender, nondistended. Bowel sounds present.  MUSCULOSKELETAL: Gait not tested because of hip fracture.    SKIN: Inspection normal. There is a small abrasion on her left wrist area.  MUSCULOSKELETAL: The patient has swelling around the left knee area and redness present. LYMPHATICS: No lymphadenopathy.  NEUROLOGIC:  Power is 5/5 in upper extremities. Not able to do her lower extremities because of the hip fracture.   IMAGING AND LABORATORY DATA:  X-RAY OF THE PELVIS REGION:  Right femur is rotated. No pelvic fractures. Left femur x-ray shows comminuted fracture posteriorly and laterally, displaced distal.  Left femoral metaphyseal fracture. More proximal left femur and hip appear intact.  Right hip x-ray is normal.  ELECTROLYTES:  Sodium 135, potassium 3.9, chloride 102, bicarb 28, BUN 19, creatinine 0.87, glucose 138. WBC 23, hemoglobin 15.2, hematocrit 43.2, platelets 302. INR is 1.   UA is hazy-colored, 2+ bacteria. WBC 10.   EKG shows sinus rhythm with first-degree AV block and PACs. No ST-T changes.   ASSESSMENT AND PLAN: 1.  The patient is a 81 year old female patient with left hip fracture secondary to mechanical fall. The patient needs orthopedic evaluation. Admit to medical service, continue pain medications, DVT prophylaxis. The patient is moderate risk for surgery because of multiple medical  problems and advanced age, but surgery can be performed.  2.  Chronic obstructive pulmonary disease. The patient has no wheezing. O2 sats are 89 documented. Continue 3 liters of oxygen. Continue Spiriva, ProAir and Perforomist and postop incentive spirometry. 3.  Hypertension.  Continue amlodipine 5 mg daily.  4.  Dementia. Continue Aricept  10 mg daily.  5.  Leukocytosis, likely secondary to stress.  6.  Mild urinary tract infection with WBC 10 and 2+ bacteria in the urine. Can continue Rocephin 1 gram daily.   Discussed the plan with the patient and patient's son.   TIME SPENT: About 60 minutes.    ____________________________ Katha HammingSnehalatha Sanari Offner, MD sk:dmm D: 11/21/2012 20:20:57 ET T: 11/21/2012 20:39:15 ET JOB#: [id number]  cc: Katha HammingSnehalatha Kooper Godshall, MD, <Dictator> Katha HammingSNEHALATHA Bracken Moffa MD ELECTRONICALLY SIGNED 12/18/2012 15:23

## 2015-11-08 IMAGING — CT CT ABD-PELV W/ CM
2 of 5 series · 16 of 46 positions shown, 18 images · IV contrast (isovue)
Comparison: None.

CLINICAL DATA: Surgery last week for broken left femur. Elevated
white blood cell count. Abdominal pain.

EXAM:
CT ABDOMEN AND PELVIS WITH CONTRAST
TECHNIQUE: Multidetector CT imaging of the abdomen and pelvis was performed
using the standard protocol following bolus administration of
intravenous contrast.
CONTRAST:  100 mL Isovue 370 IV.

[Series 2: routine abd pel with · axial · 0.86mm/px · z∈[-1042,-617]mm · 13 of 96 slices shown, 15 images]
[im 6/96  soft-tissue]
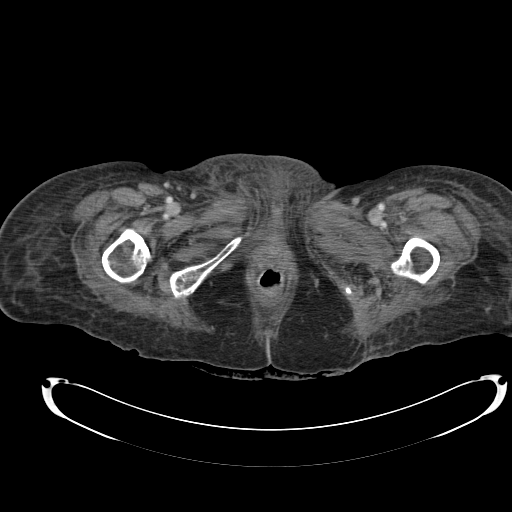
[im 6/96  bone]
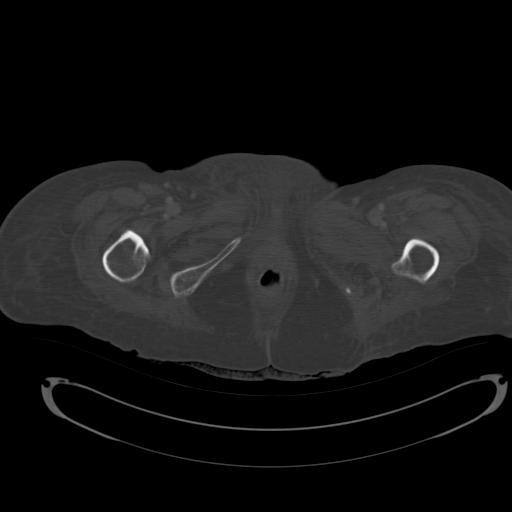
[im 16/96  soft-tissue]
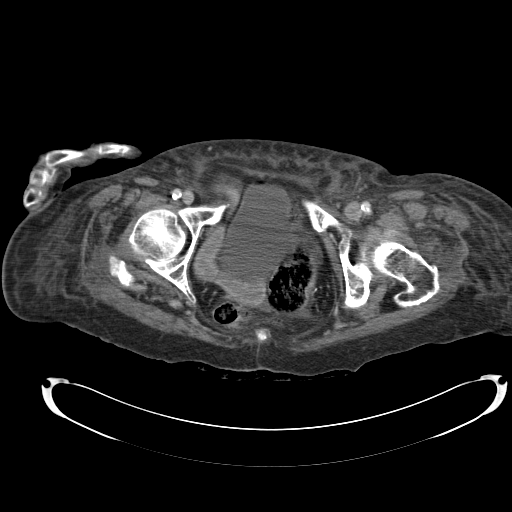
[im 21/96  soft-tissue]
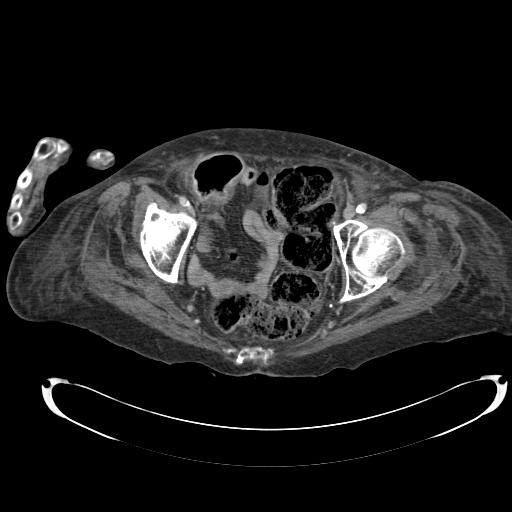
[im 26/96  soft-tissue]
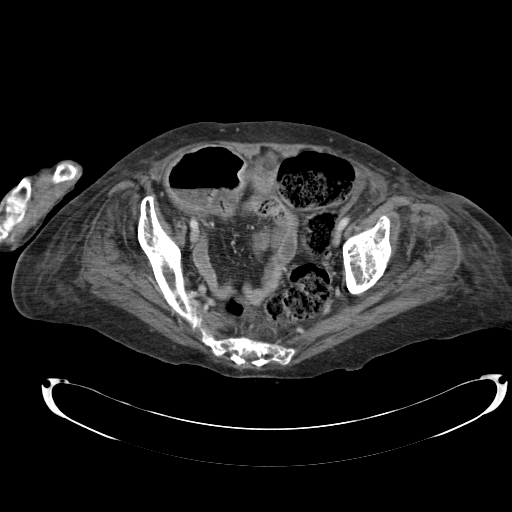
[im 36/96  soft-tissue]
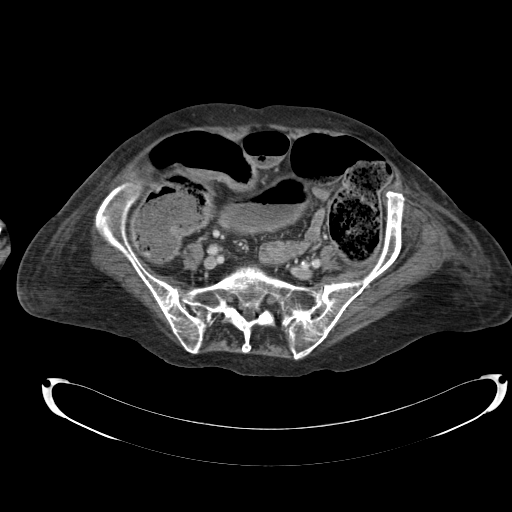
[im 41/96  soft-tissue]
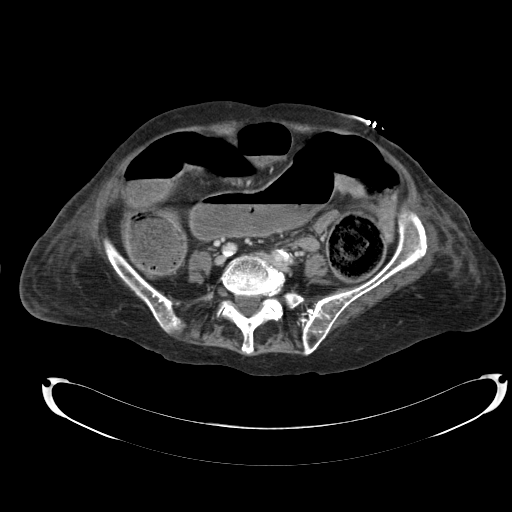
[im 51/96  soft-tissue]
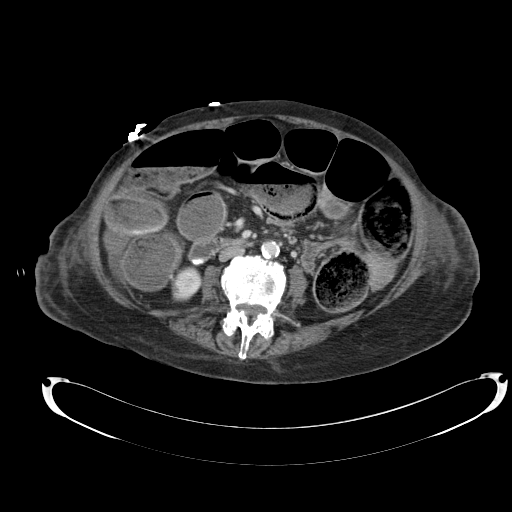
[im 56/96  soft-tissue]
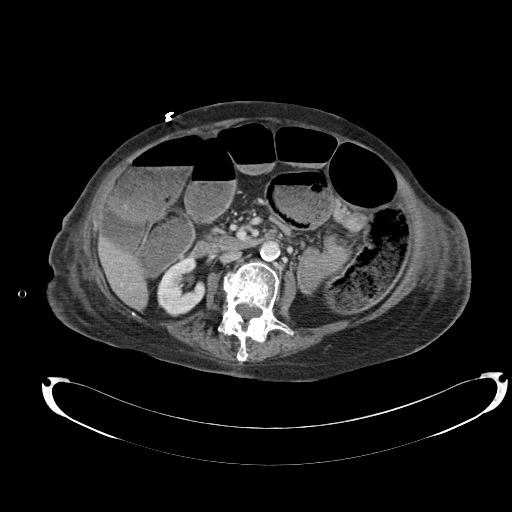
[im 61/96  soft-tissue]
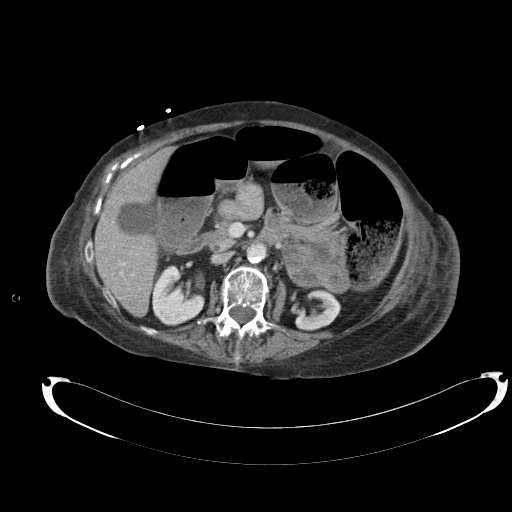
[im 61/96  bone]
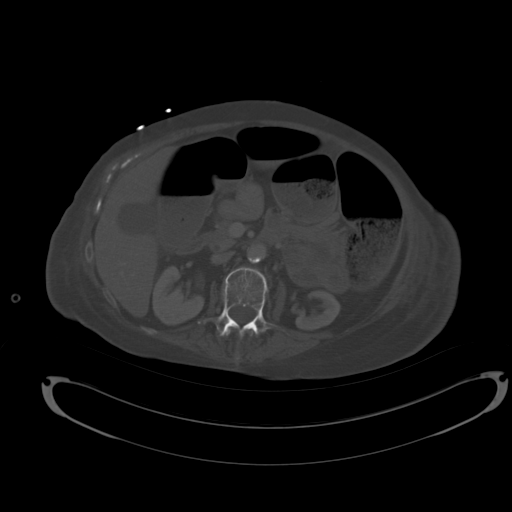
[im 71/96  soft-tissue]
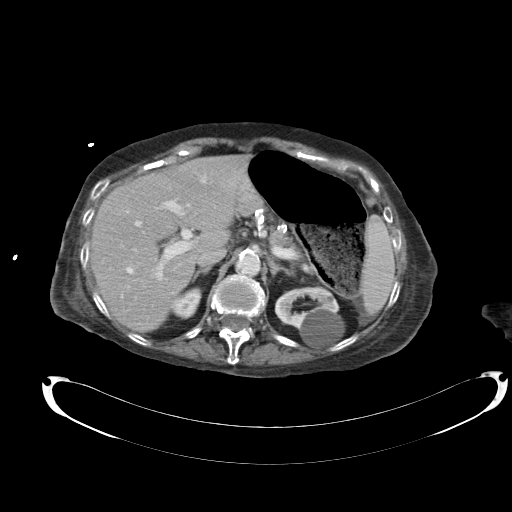
[im 76/96  soft-tissue]
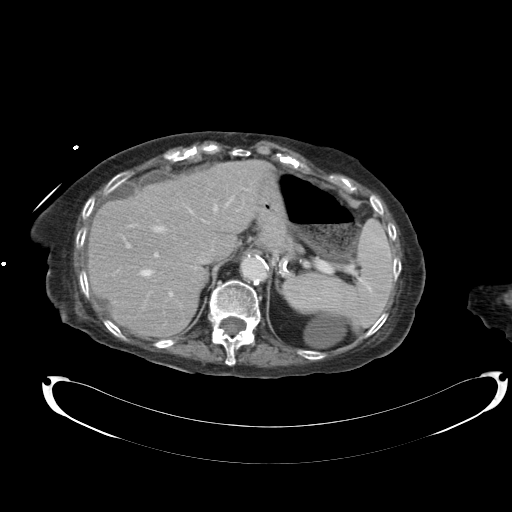
[im 81/96  soft-tissue]
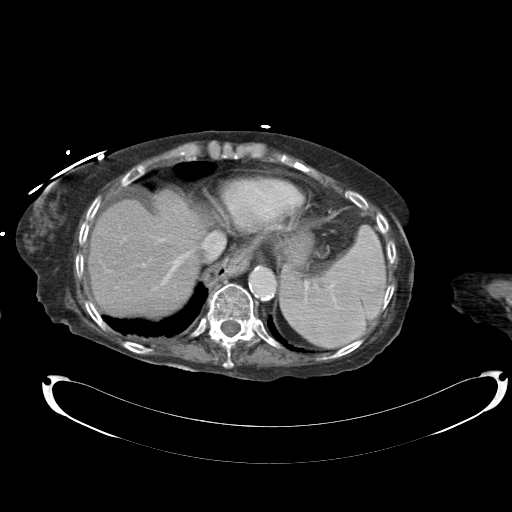
[im 91/96  soft-tissue]
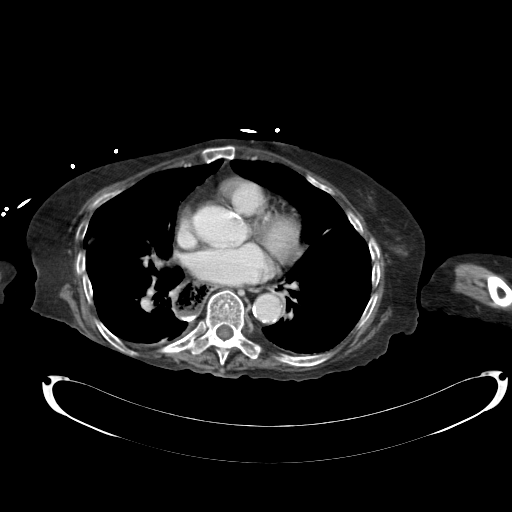

[Series 5: cor routine abd pel with · coronal · 0.73mm/px · 3 of 111 slices shown]
[im 37/111  soft-tissue]
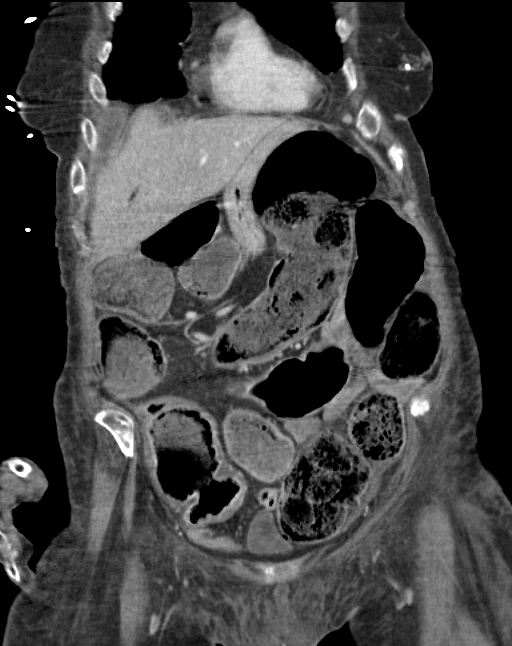
[im 49/111  soft-tissue]
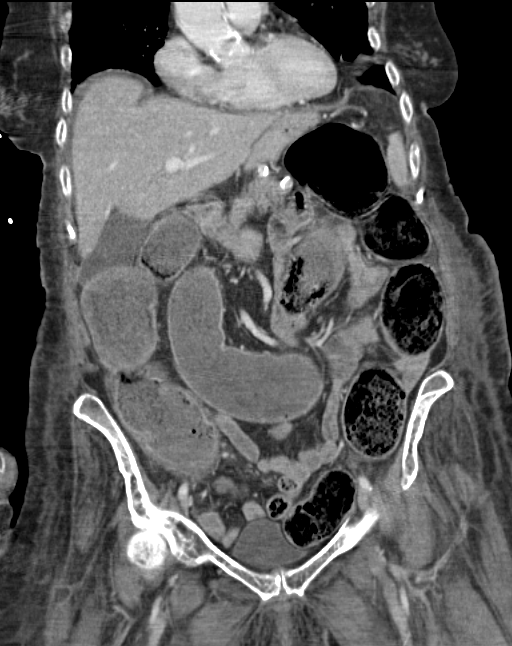
[im 62/111  soft-tissue]
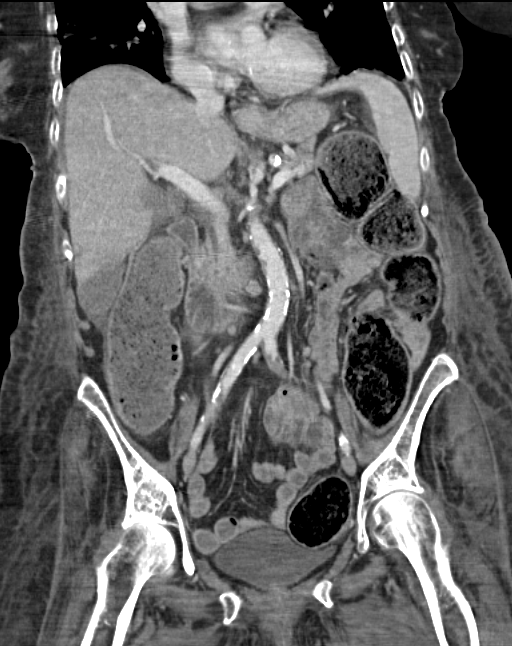

[16 of 46 positions shown; findings below may reference images not displayed]

FINDINGS: Lung bases demonstrate bibasilar linear density compatible with
atelectasis or scarring. There is a hiatal hernia.

Abdominal images demonstrate a small amount of perihepatic fluid.
There subtle increased dependent density over the gallbladder fundus
which may represent sludge versus stones. The spleen, pancreas and
adrenal glands are within normal. The right kidney is within normal.
There is a 4.4 cm cyst over the upper pole of the left kidney with
Hounsfield unit measurements of 14 post contrast and 7 on the
delayed images. There is a sub cm hypodensity over the mid to upper
pole of the left kidney too small to characterize but likely a cyst.
The ureters are unremarkable. There is moderate calcification over
the abdominal aorta and iliac vessels. Appendix is not well seen.

There are multiple air, stool in fluid-filled prominent but
nondilated colonic loops. No definite small bowel dilatation is
seen. Findings may be due to a colonic ileus. There is no free
peritoneal air.

Pelvic images are otherwise unremarkable. There is mild subcutaneous
edema over the lower abdomen and pelvis. There are degenerative
changes of the spine with mild compression deformities of L3 and L4.
IMPRESSION: Multiple air stool in fluid-filled mildly prominent but nondilated
colonic loops which may be due to a colonic ileus. Minimal
perihepatic fluid.

Mild bibasilar linear density likely atelectasis/scarring. Small
hiatal hernia.

4.4 cm simple left renal cyst. Smaller subcentimeter hypodensity too
small to characterize but likely a cyst.

Possible sludge versus stones within the gallbladder fundus.

Mild compression fractures of L3 and L4.
